# Patient Record
Sex: Female | Born: 1970 | Race: White | Hispanic: No | Marital: Married | Smoking: Never smoker
Health system: Southern US, Community
[De-identification: ages and names within clinical notes are randomized; demographics above are authoritative.]

## PROBLEM LIST (undated history)

## (undated) DIAGNOSIS — K589 Irritable bowel syndrome without diarrhea: Secondary | ICD-10-CM

## (undated) DIAGNOSIS — R000 Tachycardia, unspecified: Secondary | ICD-10-CM

## (undated) DIAGNOSIS — F3281 Premenstrual dysphoric disorder: Secondary | ICD-10-CM

## (undated) DIAGNOSIS — I498 Other specified cardiac arrhythmias: Secondary | ICD-10-CM

## (undated) DIAGNOSIS — I951 Orthostatic hypotension: Secondary | ICD-10-CM

## (undated) DIAGNOSIS — F419 Anxiety disorder, unspecified: Secondary | ICD-10-CM

## (undated) DIAGNOSIS — K839 Disease of biliary tract, unspecified: Secondary | ICD-10-CM

## (undated) DIAGNOSIS — G90A Postural orthostatic tachycardia syndrome (POTS): Secondary | ICD-10-CM

## (undated) DIAGNOSIS — R002 Palpitations: Secondary | ICD-10-CM

## (undated) DIAGNOSIS — K219 Gastro-esophageal reflux disease without esophagitis: Secondary | ICD-10-CM

## (undated) HISTORY — DX: Anxiety disorder, unspecified: F41.9

## (undated) HISTORY — DX: Palpitations: R00.2

## (undated) HISTORY — DX: Gastro-esophageal reflux disease without esophagitis: K21.9

## (undated) HISTORY — DX: Tachycardia, unspecified: R00.0

## (undated) HISTORY — DX: Premenstrual dysphoric disorder: F32.81

## (undated) HISTORY — DX: Irritable bowel syndrome, unspecified: K58.9

## (undated) HISTORY — DX: Disease of biliary tract, unspecified: K83.9

---

## 2004-01-25 ENCOUNTER — Encounter: Payer: Self-pay | Admitting: Family Medicine

## 2004-02-09 ENCOUNTER — Encounter: Payer: Self-pay | Admitting: Family Medicine

## 2004-03-11 ENCOUNTER — Encounter: Payer: Self-pay | Admitting: Family Medicine

## 2004-04-11 ENCOUNTER — Encounter: Payer: Self-pay | Admitting: Family Medicine

## 2006-06-15 ENCOUNTER — Emergency Department: Payer: Self-pay | Admitting: Internal Medicine

## 2007-01-07 ENCOUNTER — Ambulatory Visit: Payer: Self-pay | Admitting: Family Medicine

## 2007-01-21 ENCOUNTER — Ambulatory Visit: Payer: Self-pay | Admitting: Pain Medicine

## 2007-01-29 ENCOUNTER — Ambulatory Visit: Payer: Self-pay | Admitting: Pain Medicine

## 2007-03-12 HISTORY — PX: BACK SURGERY: SHX140

## 2007-03-26 ENCOUNTER — Ambulatory Visit: Payer: Self-pay | Admitting: Pain Medicine

## 2007-04-20 ENCOUNTER — Ambulatory Visit: Payer: Self-pay | Admitting: Pain Medicine

## 2007-05-05 ENCOUNTER — Ambulatory Visit: Payer: Self-pay | Admitting: Physician Assistant

## 2007-05-19 ENCOUNTER — Ambulatory Visit: Payer: Self-pay | Admitting: Pain Medicine

## 2007-05-28 ENCOUNTER — Ambulatory Visit: Payer: Self-pay

## 2007-06-01 ENCOUNTER — Ambulatory Visit: Payer: Self-pay | Admitting: Pain Medicine

## 2007-07-10 ENCOUNTER — Ambulatory Visit (HOSPITAL_COMMUNITY): Admission: RE | Admit: 2007-07-10 | Discharge: 2007-07-11 | Payer: Self-pay | Admitting: Neurosurgery

## 2007-11-13 ENCOUNTER — Ambulatory Visit: Payer: Self-pay | Admitting: Family Medicine

## 2008-01-29 ENCOUNTER — Ambulatory Visit: Payer: Self-pay | Admitting: Gastroenterology

## 2008-02-10 ENCOUNTER — Ambulatory Visit: Payer: Self-pay | Admitting: Surgery

## 2008-02-12 ENCOUNTER — Ambulatory Visit: Payer: Self-pay | Admitting: Surgery

## 2008-02-12 HISTORY — PX: CHOLECYSTECTOMY: SHX55

## 2008-04-14 ENCOUNTER — Emergency Department: Payer: Self-pay | Admitting: Emergency Medicine

## 2009-03-11 HISTORY — PX: ESOPHAGOGASTRODUODENOSCOPY: SHX1529

## 2009-07-10 ENCOUNTER — Ambulatory Visit: Payer: Self-pay | Admitting: Gastroenterology

## 2010-03-09 ENCOUNTER — Ambulatory Visit: Payer: Self-pay | Admitting: Family Medicine

## 2010-04-26 ENCOUNTER — Ambulatory Visit: Payer: Self-pay | Admitting: Orthopedic Surgery

## 2010-07-24 NOTE — Op Note (Signed)
[name], [name]                 ACCOUNT NO.:  [id number]   MEDICAL RECORD NO.:  [id number]          PATIENT TYPE:  OIB   LOCATION:  3536                         FACILITY:  [facility]   PHYSICIAN:  Danae Orleans. Venetia Maxon, M.D.  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   PROCEDURE:  Right L5-S1 microdiskectomy with microdissection.   SURGEON:  Maeola Harman, M.D.   ASSISTANT:  Coletta Memos, M.D.   ANESTHESIA:  General endotracheal anesthesia.   ESTIMATED BLOOD LOSS:  Minimal.   COMPLICATIONS:  None.   DISPOSITION:  Recovery.   INDICATIONS:  Krista Martin is a 40 year old woman with a significant disk  herniation at L5-S1 on the right with right S1 radiculopathy.  It was  elected to go to surgery for microdiskectomy.   PROCEDURE:  [name] is brought to the operating room.  Following  satisfactory uncomplicated induction of general endotracheal anesthesia  and placement of intravenous lines, the patient was placed in the prone  position on a Wilson frame.  Her low back was prepped and draped in the  usual sterile fashion.  The incision was infiltrated with 0.25% Marcaine  and 0.5% lidocaine with 1:200,000 epinephrine.  An incision was made in  the midline, carried to the lumbodorsal fascia.  This was incised to the  right side of midline.  A linear incision was made through posterior  ligament, through the lumbodorsal fascia and then subperiosteal  dissection was performed exposing the L5-S1 interspace.  Intraoperative  x-ray with marker probe confirmed correct level at the L5-S1.  Under  microscopic visualization the ligamentum flavum was detached and  removed  in a piecemeal fashion.  No bone was removed.  It was felt that the  interlaminar window was sufficiently large that the diskectomy could be  performed without bone removal.  A microscope was brought into the  field.  Using microdissection technique the right S1 nerve root was  mobilized, exposing a large free fragment of herniated disk material.  This was both within and out of the interspace.  The disk material was  removed and left a large annular rent.  Consequently it was elected to  clear the disk space of residual disk material as well, and this was  done with a variety of pituitary rongeurs.  Care was taken not to use  any curettes on the endplates and to preserve other remaining normal-  appearing disk material.  The wound was then irrigated.  Ball probes  were used, and there was felt to be significant decompression of the S1  nerve root without any residual disk material.  Hemostasis assured.  The  wound was bathed in Depo-Medrol and fentanyl.  The self-retaining  retractor was removed.  The lumbodorsal fascia was closed with 0 Vicryl  suture.  Subcutaneous tissue were  approximated with 2-0 Vicryl  interrupted inverted sutures, and the skin edges were reapproximated  with interrupted 3-0 Vicryl subcuticular stitch.  The wound was dressed  with Dermabond.  The patient was extubated in the operating room and  taken to the recovery room in stable and satisfactory condition having  tolerated the operation well.  Counts were correct at the end of the  case.      Danae Orleans. Venetia Maxon, M.D.  Electronically Signed     JDS/MEDQ  D:  07/10/2007  T:  07/10/2007  Job:  [id number]

## 2010-12-04 LAB — BASIC METABOLIC PANEL
CO2: 30
Calcium: 9.5
GFR calc Af Amer: >60
GFR calc non Af Amer: >60
Potassium: 4.2
Sodium: 140

## 2010-12-04 LAB — CBC
HCT: 40.3
Hemoglobin: 14.1
MCHC: 35.1
RBC: 4.57

## 2012-01-21 ENCOUNTER — Ambulatory Visit: Payer: Self-pay | Admitting: Internal Medicine

## 2012-03-11 DIAGNOSIS — R000 Tachycardia, unspecified: Secondary | ICD-10-CM

## 2012-03-11 HISTORY — DX: Tachycardia, unspecified: R00.0

## 2012-07-08 ENCOUNTER — Ambulatory Visit: Payer: Self-pay | Admitting: Unknown Physician Specialty

## 2012-08-19 ENCOUNTER — Emergency Department: Payer: Self-pay | Admitting: Emergency Medicine

## 2012-08-19 ENCOUNTER — Ambulatory Visit: Payer: Self-pay | Admitting: Family Medicine

## 2012-08-19 LAB — URINALYSIS, COMPLETE
Bacteria: NONE SEEN
Bilirubin,UR: NEGATIVE
Ketone: NEGATIVE
Leukocyte Esterase: NEGATIVE
Nitrite: NEGATIVE
Ph: 6 (ref 4.5–8.0)
Protein: NEGATIVE
RBC,UR: 1 /HPF (ref 0–5)
Squamous Epithelial: <1
WBC UR: 1 /HPF (ref 0–5)

## 2012-08-19 LAB — CBC
HGB: 13.8 g/dL (ref 12.0–16.0)
MCH: 29 pg (ref 26.0–34.0)
MCHC: 34.1 g/dL (ref 32.0–36.0)
MCV: 85 fL (ref 80–100)
RBC: 4.75 10*6/uL (ref 3.80–5.20)
WBC: 9.3 10*3/uL (ref 3.6–11.0)

## 2012-08-19 LAB — COMPREHENSIVE METABOLIC PANEL
Albumin: 4 g/dL (ref 3.4–5.0)
BUN: 13 mg/dL (ref 7–18)
Calcium, Total: 8.9 mg/dL (ref 8.5–10.1)
Co2: 28 mmol/L (ref 21–32)
Creatinine: 0.58 mg/dL — ABNORMAL LOW (ref 0.60–1.30)
EGFR (Non-African Amer.): >60
Glucose: 100 mg/dL — ABNORMAL HIGH (ref 65–99)
Sodium: 140 mmol/L (ref 136–145)

## 2013-01-15 ENCOUNTER — Encounter: Payer: Self-pay | Admitting: Nurse Practitioner

## 2013-04-11 DIAGNOSIS — F3281 Premenstrual dysphoric disorder: Secondary | ICD-10-CM

## 2013-04-11 HISTORY — DX: Premenstrual dysphoric disorder: F32.81

## 2013-08-08 ENCOUNTER — Ambulatory Visit: Payer: Self-pay | Admitting: Physician Assistant

## 2013-08-08 LAB — RAPID STREP-A WITH REFLX: Micro Text Report: NEGATIVE

## 2013-08-11 LAB — BETA STREP CULTURE(ARMC)

## 2014-04-07 ENCOUNTER — Ambulatory Visit: Payer: Self-pay | Admitting: Family Medicine

## 2014-09-20 ENCOUNTER — Other Ambulatory Visit: Payer: Self-pay | Admitting: Unknown Physician Specialty

## 2014-09-20 DIAGNOSIS — M5414 Radiculopathy, thoracic region: Secondary | ICD-10-CM

## 2014-09-20 DIAGNOSIS — R0781 Pleurodynia: Secondary | ICD-10-CM

## 2014-09-23 ENCOUNTER — Ambulatory Visit: Payer: Self-pay

## 2014-09-27 ENCOUNTER — Ambulatory Visit
Admission: RE | Admit: 2014-09-27 | Discharge: 2014-09-27 | Disposition: A | Discharge status: Home / Self Care | Payer: No Typology Code available for payment source | Source: Ambulatory Visit | Attending: Unknown Physician Specialty | Admitting: Unknown Physician Specialty

## 2014-09-27 DIAGNOSIS — M546 Pain in thoracic spine: Secondary | ICD-10-CM | POA: Diagnosis present

## 2014-09-27 DIAGNOSIS — R0781 Pleurodynia: Secondary | ICD-10-CM | POA: Diagnosis not present

## 2014-09-27 DIAGNOSIS — M5414 Radiculopathy, thoracic region: Secondary | ICD-10-CM

## 2014-12-07 LAB — HM MAMMOGRAPHY

## 2014-12-07 LAB — HM PAP SMEAR

## 2016-07-18 ENCOUNTER — Ambulatory Visit
Admission: EM | Admit: 2016-07-18 | Discharge: 2016-07-18 | Disposition: A | Discharge status: Home / Self Care | Payer: BLUE CROSS/BLUE SHIELD | Attending: Emergency Medicine | Admitting: Emergency Medicine

## 2016-07-18 DIAGNOSIS — F419 Anxiety disorder, unspecified: Secondary | ICD-10-CM | POA: Diagnosis not present

## 2016-07-18 DIAGNOSIS — R1114 Bilious vomiting: Secondary | ICD-10-CM

## 2016-07-18 HISTORY — DX: Other specified cardiac arrhythmias: I49.8

## 2016-07-18 HISTORY — DX: Postural orthostatic tachycardia syndrome (POTS): G90.A

## 2016-07-18 HISTORY — DX: Tachycardia, unspecified: R00.0

## 2016-07-18 HISTORY — DX: Orthostatic hypotension: I95.1

## 2016-07-18 LAB — CBC WITH DIFFERENTIAL/PLATELET
Basophils Absolute: 0.1 10*3/uL (ref 0–0.1)
Basophils Relative: 1 %
Eosinophils Absolute: 0 10*3/uL (ref 0–0.7)
Eosinophils Relative: 1 %
HCT: 42.7 % (ref 35.0–47.0)
Hemoglobin: 14.1 g/dL (ref 12.0–16.0)
Lymphocytes Relative: 21 %
Lymphs Abs: 1.5 10*3/uL (ref 1.0–3.6)
MCH: 28.3 pg (ref 26.0–34.0)
MCHC: 33 g/dL (ref 32.0–36.0)
MCV: 85.9 fL (ref 80.0–100.0)
Monocytes Absolute: 0.4 10*3/uL (ref 0.2–0.9)
Monocytes Relative: 6 %
Neutro Abs: 5.1 10*3/uL (ref 1.4–6.5)
Neutrophils Relative %: 71 %
Platelets: 272 10*3/uL (ref 150–440)
RBC: 4.97 MIL/uL (ref 3.80–5.20)
RDW: 13.7 % (ref 11.5–14.5)
WBC: 7.2 10*3/uL (ref 3.6–11.0)

## 2016-07-18 LAB — COMPREHENSIVE METABOLIC PANEL
ALT: 13 U/L — ABNORMAL LOW (ref 14–54)
AST: 20 U/L (ref 15–41)
Albumin: 4.6 g/dL (ref 3.5–5.0)
Alkaline Phosphatase: 47 U/L (ref 38–126)
Anion gap: 5 (ref 5–15)
BUN: 10 mg/dL (ref 6–20)
CO2: 25 mmol/L (ref 22–32)
Calcium: 9.3 mg/dL (ref 8.9–10.3)
Chloride: 105 mmol/L (ref 101–111)
Creatinine, Ser: 0.58 mg/dL (ref 0.44–1.00)
GFR calc Af Amer: >60 mL/min (ref >60)
GFR calc non Af Amer: >60 mL/min (ref >60)
Glucose, Bld: 109 mg/dL — ABNORMAL HIGH (ref 65–99)
Potassium: 3.8 mmol/L (ref 3.5–5.1)
Sodium: 135 mmol/L (ref 135–145)
Total Bilirubin: 0.3 mg/dL (ref 0.3–1.2)
Total Protein: 7.6 g/dL (ref 6.5–8.1)

## 2016-07-18 MED ORDER — ALPRAZOLAM 0.25 MG PO TABS: 0.2500 mg | ORAL_TABLET | Freq: Every evening | ORAL | 0 refills | Status: DC | PRN

## 2016-07-18 NOTE — ED Triage Notes (Signed)
Pt presents with vomiting and diarrhea, she has an issue with her liver producing extra bile, she is here because she feels like she needs to have her liver checked. Throwing up all morning, it was all bile.

## 2016-07-18 NOTE — ED Provider Notes (Signed)
CSN: [id number]     Arrival date & time 07/18/16  1430 History   First MD Initiated Contact with Patient 07/18/16 1516     Chief Complaint  Patient presents with  . Emesis   (Consider location/radiation/quality/duration/timing/severity/associated sxs/prior Treatment) HPI   45year-old female who presents with vomiting and diarrhea that started about 4 days ago. She become much worse today. She had one episode of vomiting this morning which was probably bile according to the patient and loose stool with mucus but no blood. She's had a feeling of fullness in her stomach since she's taken cholestyramine prescribed by a GI doctor at [facility]. Ever since she's had a cholecystectomy she's had bowel and abdominal problems. Is usually affected by stress. She has recently been under a great deal of stress with her husband having had an emergency transfer to [facility] recently for a newly diagnosed diabetes mellitus States anytime she has increased stress that she has these GI issues. She is mostly concerned today about possible liver involvement due to the amount of bowel that she has been vomiting. She is also concerned that she has taken some of the cholestyramine that was out of date by almost 2 years.        Past Medical History:  Diagnosis Date  . POTS (postural orthostatic tachycardia syndrome)    Past Surgical History:  Procedure Laterality Date  . CHOLECYSTECTOMY     History reviewed. No pertinent family history. Social History  Substance Use Topics  . Smoking status: Never Smoker  . Smokeless tobacco: Never Used  . Alcohol use No   OB History    No data available     Review of Systems  Constitutional: Positive for activity change and appetite change. Negative for chills, fatigue and fever.  Gastrointestinal: Positive for abdominal pain, diarrhea, nausea and vomiting.  Genitourinary: Negative for dysuria and hematuria.  All other systems reviewed and are negative.   Allergies   Ciprofloxacin; Imipramine; and Sertraline  Home Medications   Prior to Admission medications   Medication Sig Start Date End Date Taking? Authorizing Provider  cholestyramine (QUESTRAN) 4 g packet Take 4 g by mouth 3 (three) times daily with meals.   Yes [provider]  clonazePAM (KLONOPIN) 0.5 MG tablet Take 0.5 mg by mouth 2 (two) times daily as needed for anxiety.   Yes [provider]  gabapentin (NEURONTIN) 100 MG capsule Take 100 mg by mouth 3 (three) times daily.   Yes [provider]  magnesium 30 MG tablet Take 30 mg by mouth 2 (two) times daily.   Yes [provider]  nebivolol (BYSTOLIC) 2.5 MG tablet Take 2.5 mg by mouth daily.   Yes [provider]  ondansetron (ZOFRAN) 4 MG tablet Take 4 mg by mouth every 8 (eight) hours as needed for nausea or vomiting.   Yes [provider]  polyethylene glycol (MIRALAX / GLYCOLAX) packet Take 17 g by mouth daily.   Yes [provider]  ALPRAZolam (XANAX) 0.25 MG tablet Take 1 tablet (0.25 mg total) by mouth at bedtime as needed for anxiety. 07/18/16   Lutricia Feil, PA-C   Meds Ordered and Administered this Visit  Medications - No data to display  BP 121/75 (BP Location: Left Arm)   Pulse 79   Temp 98.3 F (36.8 C) (Oral)   Resp 18   LMP 07/14/2016   SpO2 100%  No data found.   Physical Exam  Constitutional: She is oriented to person, place,  and time. She appears well-developed and well-nourished. No distress.  HENT:  Head: Normocephalic.  Eyes: Pupils are equal, round, and reactive to light.  Neck: Normal range of motion.  Pulmonary/Chest: Effort normal and breath sounds normal. No respiratory distress. She has no wheezes. She has no rales.  Abdominal: Soft. Bowel sounds are normal. She exhibits no distension and no mass. There is no tenderness. There is no rebound and no guarding.  Musculoskeletal: Normal range of motion.  Neurological: She is alert and  oriented to person, place, and time.  Skin: Skin is warm and dry. She is not diaphoretic.  Psychiatric: She has a normal mood and affect. Her behavior is normal. Judgment and thought content normal.  Nursing note and vitals reviewed.   Urgent Care Course   Clinical Course as of Jul 19 1643  Thu Jul 18, 2016  1623 AST: 20 [WR]  1624 AST: 20 [WR]  1624 AST: 20 [WR]  1624 ALT: (!) 13 [WR]    Clinical Course User Index [WR] Lutricia Feiloemer, Tyvion Edmondson P, PA-C    Procedures (including critical care time)  Labs Review Labs Reviewed  COMPREHENSIVE METABOLIC PANEL - Abnormal; Notable for the following:       Result Value   Glucose, Bld 109 (*)    ALT 13 (*)    All other components within normal limits  CBC WITH DIFFERENTIAL/PLATELET    Imaging Review No results found.   Visual Acuity Review  Right Eye Distance:   Left Eye Distance:   Bilateral Distance:    Right Eye Near:   Left Eye Near:    Bilateral Near:         MDM   1. Bilious vomiting with nausea   2. Anxiety    Discharge Medication List as of 07/18/2016  4:38 PM    START taking these medications   Details  ALPRAZolam (XANAX) 0.25 MG tablet Take 1 tablet (0.25 mg total) by mouth at bedtime as needed for anxiety., Starting Thu 07/18/2016, Print      Plan: 1. Test/x-ray results and diagnosis reviewed with patient 2. rx as per orders; risks, benefits, potential side effects reviewed with patient 3. Recommend supportive treatment with use of Colace or Metamucil to soften her stools. Is reassured that all of her laboratories are normal. I recommend she follow-up with her primary care physician or [facility]. For her stress and her inability to sleep because the stress I will give her Xanax total of 5 pills until she can be seen by her primary care physician. 4. F/u prn if symptoms worsen or don't improve     Lutricia FeilRoemer, Vibhav Waddill P, PA-C 07/18/16 1645

## 2016-07-18 NOTE — Discharge Instructions (Signed)
Use Colace daily to twice daily for as a stool softener. Consider using Metamucil to twice daily also. Follow-up with your GI physician

## 2016-10-10 ENCOUNTER — Encounter: Payer: Self-pay | Admitting: Obstetrics and Gynecology

## 2016-10-10 ENCOUNTER — Ambulatory Visit (INDEPENDENT_AMBULATORY_CARE_PROVIDER_SITE_OTHER): Payer: BLUE CROSS/BLUE SHIELD | Admitting: Obstetrics and Gynecology

## 2016-10-10 VITALS — BP 120/80 | HR 85 | Ht 68.0 in | Wt 145.0 lb

## 2016-10-10 DIAGNOSIS — N644 Mastodynia: Secondary | ICD-10-CM | POA: Diagnosis not present

## 2016-10-10 DIAGNOSIS — N63 Unspecified lump in unspecified breast: Secondary | ICD-10-CM | POA: Diagnosis not present

## 2016-10-10 DIAGNOSIS — Z1239 Encounter for other screening for malignant neoplasm of breast: Secondary | ICD-10-CM

## 2016-10-10 DIAGNOSIS — Z1231 Encounter for screening mammogram for malignant neoplasm of breast: Secondary | ICD-10-CM

## 2016-10-10 NOTE — Progress Notes (Signed)
Chief Complaint  Patient presents with  . Breast Problem    swelling under both armpits with sharp pain    HPI:      [name] is a 46 y.o. [id number] who LMP was Patient's last menstrual period was 09/27/2016., presents today for swelling bilat breast tissue axillary area for the past 2 wks. Pt usually has these sx before menses and then they resolve when menses starts--going on for yrs. Sx didn't resolve with LMP 2 wks ago and have persisted. Pt now has noted occas sharp pains from axillary tail towards nipples bilat. No masses/erythema. Pt is anxious about sx. Pt usually doesn't drink caffeine but has had some recently. [place] and had increased emotional stress. Pt's last mammo 12/07/14, neg. Pt has a hx of severe PMDD sx 12 days before menses and is extremely sensitive to hormones and hormonal changes.  No FH breast/ovar cancer.  Has annual with PCP next wk but wants mammo ASAP since going out of town next wk.  Past Medical History:  Diagnosis Date  . Anxiety   . Biliary tract disease   . GERD (gastroesophageal reflux disease)   . Irritable bowel syndrome   . Palpitations   . PMDD (premenstrual dysphoric disorder) 04/11/2013   pt can't tolerate SSRIs, estrogen or progestin  . POTS (postural orthostatic tachycardia syndrome)   . Tachycardia 2014   dx'd c POTS    Past Surgical History:  Procedure Laterality Date  . BACK SURGERY  2009   DISCECTOMY L5 (FUSION)  . CHOLECYSTECTOMY  02/12/2008  . ESOPHAGOGASTRODUODENOSCOPY  2011    Family History  Problem Relation Age of Onset  . Heart disease Father   . Heart disease Paternal Grandmother     Social History   Social History  . Marital status: Married    Spouse name: N/A  . Number of children: N/A  . Years of education: N/A   Occupational History  . Not on file.   Social History Main Topics  . Smoking status: Never Smoker  . Smokeless tobacco: Never Used  . Alcohol use Yes    . Drug use: No  . Sexual activity: Yes    Birth control/ protection: Surgical   Other Topics Concern  . Not on file   Social History Narrative  . No narrative on file     Current Outpatient Prescriptions:  .  cholestyramine (QUESTRAN) 4 g packet, Take 4 g by mouth 3 (three) times daily with meals., Disp: , Rfl:  .  clonazePAM (KLONOPIN) 0.5 MG tablet, Take 0.5 mg by mouth 2 (two) times daily as needed for anxiety., Disp: , Rfl:  .  gabapentin (NEURONTIN) 100 MG capsule, Take 100 mg by mouth 3 (three) times daily., Disp: , Rfl:  .  nebivolol (BYSTOLIC) 2.5 MG tablet, Take 2.5 mg by mouth daily., Disp: , Rfl:  .  NEOMYCIN-POLYMYXIN-HYDROCORTISONE (CORTISPORIN) 1 % SOLN OTIC solution, Place in ear(s)., Disp: , Rfl:  .  ALPRAZolam (XANAX) 0.25 MG tablet, Take 1 tablet (0.25 mg total) by mouth at bedtime as needed for anxiety. (Patient not taking: Reported on 10/10/2016), Disp: 5 tablet, Rfl: 0 .  magnesium 30 MG tablet, Take 30 mg by mouth 2 (two) times daily., Disp: , Rfl:  .  Multiple Vitamin (MULTI-VITAMINS) TABS, as needed. , Disp: , Rfl:  .  ondansetron (ZOFRAN) 4 MG tablet, Take 4 mg by mouth every 8 (eight) hours as needed for nausea or vomiting.,  Disp: , Rfl:  .  polyethylene glycol (MIRALAX / GLYCOLAX) packet, Take 17 g by mouth daily., Disp: , Rfl:  .  SUMAtriptan (IMITREX) 50 MG tablet, , Disp: , Rfl: 4   ROS:  Review of Systems  Constitutional: Negative for fever.  Gastrointestinal: Negative for blood in stool, constipation, diarrhea, nausea and vomiting.  Genitourinary: Positive for vaginal discharge. Negative for dyspareunia, dysuria, flank pain, frequency, hematuria, urgency, vaginal bleeding and vaginal pain.  Musculoskeletal: Negative for back pain.  Skin: Negative for rash.  Neurological: Positive for headaches.  Psychiatric/Behavioral: The patient is nervous/anxious.   Breast ROS: positive for - tenderness, swelling    OBJECTIVE:   Vitals:  BP 120/80   Pulse  85   Ht 5\' 8"  (1.727 m)   Wt 145 lb (65.8 kg)   LMP 09/27/2016   BMI 22.05 kg/m   Physical Exam  Constitutional: She is oriented to person, place, and time and well-developed, well-nourished, and in no distress.  Pulmonary/Chest: Right breast exhibits tenderness. Right breast exhibits no inverted nipple, no mass, no nipple discharge and no skin change. Left breast exhibits tenderness. Left breast exhibits no inverted nipple, no mass, no nipple discharge and no skin change. Breasts are symmetrical.    BILAT AXILLARY TAILS WITH MILD SWELLING IN SITTING POSITION; NO MASSES, LESIONS  Lymphadenopathy:    She has no axillary adenopathy.  Neurological: She is alert and oriented to person, place, and time.  Psychiatric: Affect and judgment normal.  Vitals reviewed.   Assessment/Plan: Breast swelling - Slightly enlarged axillary tail bilat breast. No masses. Probably hormonal. See if sx resolve with next menses. Due for scr mammo anyway.  Breast tenderness  Screening for breast cancer - Pt to sched mammo. - Plan: MM DIGITAL SCREENING BILATERAL     Return if symptoms worsen or fail to improve.  Alicia B. Copland, PA-C 10/10/2016 12:16 PM

## 2016-10-29 ENCOUNTER — Ambulatory Visit
Admission: RE | Admit: 2016-10-29 | Discharge: 2016-10-29 | Disposition: A | Discharge status: Home / Self Care | Payer: BLUE CROSS/BLUE SHIELD | Source: Ambulatory Visit | Attending: Obstetrics and Gynecology | Admitting: Obstetrics and Gynecology

## 2016-10-29 DIAGNOSIS — Z1239 Encounter for other screening for malignant neoplasm of breast: Secondary | ICD-10-CM

## 2016-10-29 DIAGNOSIS — Z1231 Encounter for screening mammogram for malignant neoplasm of breast: Secondary | ICD-10-CM | POA: Diagnosis present

## 2016-11-04 ENCOUNTER — Other Ambulatory Visit: Payer: Self-pay | Admitting: *Deleted

## 2016-11-04 ENCOUNTER — Inpatient Hospital Stay
Admission: RE | Admit: 2016-11-04 | Discharge: 2016-11-04 | Disposition: A | Discharge status: Home / Self Care | Payer: Self-pay | Source: Ambulatory Visit | Attending: *Deleted | Admitting: *Deleted

## 2016-11-04 DIAGNOSIS — Z9289 Personal history of other medical treatment: Secondary | ICD-10-CM

## 2016-11-05 ENCOUNTER — Encounter: Payer: Self-pay | Admitting: Obstetrics and Gynecology

## 2017-11-26 ENCOUNTER — Ambulatory Visit (INDEPENDENT_AMBULATORY_CARE_PROVIDER_SITE_OTHER): Payer: BLUE CROSS/BLUE SHIELD

## 2017-11-26 ENCOUNTER — Ambulatory Visit
Admission: EM | Admit: 2017-11-26 | Discharge: 2017-11-26 | Disposition: A | Discharge status: Home / Self Care | Payer: BLUE CROSS/BLUE SHIELD | Attending: Family Medicine | Admitting: Family Medicine

## 2017-11-26 ENCOUNTER — Other Ambulatory Visit: Payer: Self-pay

## 2017-11-26 DIAGNOSIS — R140 Abdominal distension (gaseous): Secondary | ICD-10-CM | POA: Diagnosis not present

## 2017-11-26 DIAGNOSIS — B89 Unspecified parasitic disease: Secondary | ICD-10-CM

## 2017-11-26 LAB — CBC WITH DIFFERENTIAL/PLATELET
BASOS ABS: 0.1 10*3/uL (ref 0–0.1)
Basophils Relative: 1 %
EOS PCT: 1 %
Eosinophils Absolute: 0.1 10*3/uL (ref 0–0.7)
HEMATOCRIT: 42.7 % (ref 35.0–47.0)
Hemoglobin: 14.2 g/dL (ref 12.0–16.0)
LYMPHS ABS: 2.4 10*3/uL (ref 1.0–3.6)
LYMPHS PCT: 27 %
MCH: 29 pg (ref 26.0–34.0)
MCHC: 33.4 g/dL (ref 32.0–36.0)
MCV: 86.8 fL (ref 80.0–100.0)
MONO ABS: 0.6 10*3/uL (ref 0.2–0.9)
Monocytes Relative: 6 %
NEUTROS ABS: 5.7 10*3/uL (ref 1.4–6.5)
Neutrophils Relative %: 65 %
PLATELETS: 249 10*3/uL (ref 150–440)
RBC: 4.91 MIL/uL (ref 3.80–5.20)
RDW: 13.5 % (ref 11.5–14.5)
WBC: 8.8 10*3/uL (ref 3.6–11.0)

## 2017-11-26 LAB — COMPREHENSIVE METABOLIC PANEL
ALBUMIN: 4.5 g/dL (ref 3.5–5.0)
ALT: 14 U/L (ref 0–44)
ANION GAP: 12 (ref 5–15)
AST: 26 U/L (ref 15–41)
Alkaline Phosphatase: 47 U/L (ref 38–126)
BILIRUBIN TOTAL: 0.8 mg/dL (ref 0.3–1.2)
BUN: 16 mg/dL (ref 6–20)
CO2: 19 mmol/L — AB (ref 22–32)
Calcium: 9.1 mg/dL (ref 8.9–10.3)
Chloride: 106 mmol/L (ref 98–111)
Creatinine, Ser: 0.68 mg/dL (ref 0.44–1.00)
GFR calc Af Amer: >60 mL/min (ref >60)
GFR calc non Af Amer: >60 mL/min (ref >60)
GLUCOSE: 97 mg/dL (ref 70–99)
POTASSIUM: 4 mmol/L (ref 3.5–5.1)
SODIUM: 137 mmol/L (ref 135–145)
TOTAL PROTEIN: 7.6 g/dL (ref 6.5–8.1)

## 2017-11-26 MED ORDER — ALBENDAZOLE 200 MG PO TABS: 400.0000 mg | ORAL_TABLET | Freq: Once | ORAL | 0 refills | Status: AC

## 2017-11-26 MED ORDER — MEBENDAZOLE 100 MG PO CHEW: 100.0000 mg | CHEWABLE_TABLET | Freq: Two times a day (BID) | ORAL | 0 refills | Status: DC

## 2017-11-26 NOTE — Discharge Instructions (Addendum)
Increase water intake Follow up with Gastroenterologist

## 2017-11-26 NOTE — ED Triage Notes (Signed)
Pt with 4 weeks of abdominal bloating progressively worsening each week. No gallbladder. States it feels like a blockage or constipation. Took Metamucil last night and had a normal-sized bowel movement. Then this afternoon had another bowel movement and "something came out of me and it has a rubbery texture and almost looked like an intestine"

## 2017-11-26 NOTE — ED Provider Notes (Signed)
[facility]    CSN: [id number] Arrival date & time: 11/26/17  1705     History   Chief Complaint Chief Complaint  Patient presents with  . Bloated    HPI Krista Martin is a 47 y.o. female.   47 yo female with a c/o 2-3 weeks of progressively worsening abdominal discomfort and bloating with an episode today of an abnormal bowel movement with defecation of an abnormal "long, rubbery, semi-solid" foreign substance. Denies any fevers, chills, diarrhea, recent travel, unclean water exposure, suspicious foods. Does state that her dog has had some abnormal skin lesions which she does not know what they are.     The history is provided by the patient.    Past Medical History:  Diagnosis Date  . Anxiety   . Biliary tract disease   . GERD (gastroesophageal reflux disease)   . Irritable bowel syndrome   . Palpitations   . PMDD (premenstrual dysphoric disorder) 04/11/2013   pt can't tolerate SSRIs, estrogen or progestin  . POTS (postural orthostatic tachycardia syndrome)   . Tachycardia 2014   dx'd c POTS    There are no active problems to display for this patient.   Past Surgical History:  Procedure Laterality Date  . BACK SURGERY  2009   DISCECTOMY L5 (FUSION)  . CHOLECYSTECTOMY  02/12/2008  . ESOPHAGOGASTRODUODENOSCOPY  2011    OB History    Gravida  2   Para  2   Term  2   Preterm      AB      Living  2     SAB      TAB      Ectopic      Multiple      Live Births  1            Home Medications    Prior to Admission medications   Medication Sig Start Date End Date Taking? Authorizing Provider  simethicone (MYLICON) 125 MG chewable tablet Chew 125 mg by mouth every 6 (six) hours as needed for flatulence.   Yes [provider]  albendazole (ALBENZA) 200 MG tablet Take 2 tablets (400 mg total) by mouth once for 1 dose. 11/26/17 11/26/17  Payton Mccallumonty, Greer Wainright, MD  ALPRAZolam Prudy Feeler(XANAX) 0.25 MG tablet Take 1 tablet (0.25 mg total)  by mouth at bedtime as needed for anxiety. Patient not taking: Reported on 10/10/2016 07/18/16   Lutricia Feiloemer, William P, PA-C  cholestyramine Lanetta Inch(QUESTRAN) 4 g packet Take 4 g by mouth 3 (three) times daily with meals.    [provider]  clonazePAM (KLONOPIN) 0.5 MG tablet Take 0.5 mg by mouth 2 (two) times daily as needed for anxiety.    [provider]  gabapentin (NEURONTIN) 100 MG capsule Take 100 mg by mouth 3 (three) times daily.    [provider]  magnesium 30 MG tablet Take 30 mg by mouth 2 (two) times daily.    [provider]  Multiple Vitamin (MULTI-VITAMINS) TABS as needed.     [provider]  nebivolol (BYSTOLIC) 2.5 MG tablet Take 2.5 mg by mouth daily.    [provider]  NEOMYCIN-POLYMYXIN-HYDROCORTISONE (CORTISPORIN) 1 % SOLN OTIC solution Place in ear(s). 04/17/15   [provider]  ondansetron (ZOFRAN) 4 MG tablet Take 4 mg by mouth every 8 (eight) hours as needed for nausea or vomiting.    [provider]  polyethylene glycol (MIRALAX / GLYCOLAX) packet Take 17 g by mouth daily.  [provider]  SUMAtriptan (IMITREX) 50 MG tablet  07/22/16   [provider]    Family History Family History  Problem Relation Age of Onset  . Heart disease Father   . Heart disease Paternal Grandmother     Social History Social History   Tobacco Use  . Smoking status: Never Smoker  . Smokeless tobacco: Never Used  Substance Use Topics  . Alcohol use: Yes    Comment: social  . Drug use: No     Allergies   Ciprofloxacin; Imipramine; Imipramine hcl; Mirtazapine; and Sertraline   Review of Systems Review of Systems   Physical Exam Triage Vital Signs ED Triage Vitals [11/26/17 1718]  Enc Vitals Group     BP (!) 176/84     Pulse Rate (!) 109     Resp 18     Temp 98.2 F (36.8 C)     Temp Source Oral     SpO2 99 %     Weight 155 lb (70.3 kg)     Height 5\' 8"  (1.727 m)     Head  Circumference      Peak Flow      Pain Score 0     Pain Loc      Pain Edu?      Excl. in GC?    No data found.  Updated Vital Signs BP (!) 176/84 (BP Location: Left Arm)   Pulse (!) 109   Temp 98.2 F (36.8 C) (Oral)   Resp 18   Ht 5\' 8"  (1.727 m)   Wt 70.3 kg   LMP 11/15/2017   SpO2 99%   BMI 23.57 kg/m   Visual Acuity Right Eye Distance:   Left Eye Distance:   Bilateral Distance:    Right Eye Near:   Left Eye Near:    Bilateral Near:     Physical Exam  Constitutional: She appears well-developed and well-nourished. No distress.  Abdominal: Soft. Bowel sounds are normal. She exhibits distension (mild). She exhibits no mass. There is tenderness (mild; difuse). There is no rebound and no guarding.  Skin: She is not diaphoretic.  Nursing note and vitals reviewed.    UC Treatments / Results  Labs (all labs ordered are listed, but only abnormal results are displayed) Labs Reviewed  COMPREHENSIVE METABOLIC PANEL - Abnormal; Notable for the following components:      Result Value   CO2 19 (*)    All other components within normal limits  OVA + PARASITE EXAM  GASTROINTESTINAL PANEL BY PCR, STOOL (REPLACES STOOL CULTURE)  CBC WITH DIFFERENTIAL/PLATELET    EKG None  Radiology Dg Abd 2 Views  Result Date: 11/26/2017 CLINICAL DATA:  Four weeks of abdominal bloating. EXAM: ABDOMEN - 2 VIEW COMPARISON:  None. FINDINGS: The bowel gas pattern is normal. There is no free intraperitoneal air or significantly increased colonic stool burden. There are multiple bilateral pelvic calcifications which are indeterminate, although likely phleboliths. Cholecystectomy clips are noted. There are no acute osseous findings. IMPRESSION: No radiographic evidence of active abdominal process. Electronically Signed   By: Carey Bullocks M.D.   On: 11/26/2017 18:44    Procedures Procedures (including critical care time)  Medications Ordered in UC Medications - No data to  display  Initial Impression / Assessment and Plan / UC Course  I have reviewed the triage vital signs and the nursing notes.  Pertinent labs & imaging results that were available during my care of the patient were reviewed by me  and considered in my medical decision making (see chart for details).      Final Clinical Impressions(s) / UC Diagnoses   Final diagnoses:  Abdominal bloating  Parasite infection     Discharge Instructions     Increase water intake Follow up with Gastroenterologist     ED Prescriptions    Medication Sig Dispense Auth. Provider   mebendazole (VERMOX) 100 MG chewable tablet  (Status: Discontinued) Chew 1 tablet (100 mg total) by mouth 2 (two) times daily. 6 tablet Payton Mccallum, MD   albendazole (ALBENZA) 200 MG tablet Take 2 tablets (400 mg total) by mouth once for 1 dose. 2 tablet Payton Mccallum, MD      1. Labs/x-ray result (negative) and diagnosis reviewed with patient 2. rx as per orders above; reviewed possible side effects, interactions, risks and benefits; rx for albendazol (as mebendazole not covered by her insurance)  3. Recommend supportive treatment with increased fluids  4. Follow-up prn if symptoms worsen or don't improve Controlled Substance Prescriptions [place] Controlled Substance Registry consulted? Not Applicable   Payton Mccallum, MD 11/26/17 2038

## 2017-11-27 LAB — GASTROINTESTINAL PANEL BY PCR, STOOL (REPLACES STOOL CULTURE)

## 2017-12-02 LAB — OVA + PARASITE EXAM

## 2017-12-02 LAB — O&P RESULT

## 2017-12-11 ENCOUNTER — Ambulatory Visit
Admission: EM | Admit: 2017-12-11 | Discharge: 2017-12-11 | Disposition: A | Discharge status: Home / Self Care | Payer: BLUE CROSS/BLUE SHIELD | Attending: Emergency Medicine | Admitting: Emergency Medicine

## 2017-12-11 ENCOUNTER — Other Ambulatory Visit: Payer: Self-pay

## 2017-12-11 DIAGNOSIS — K219 Gastro-esophageal reflux disease without esophagitis: Secondary | ICD-10-CM

## 2017-12-11 DIAGNOSIS — K521 Toxic gastroenteritis and colitis: Secondary | ICD-10-CM

## 2017-12-11 DIAGNOSIS — Z760 Encounter for issue of repeat prescription: Secondary | ICD-10-CM

## 2017-12-11 LAB — COMPREHENSIVE METABOLIC PANEL
ALT: 16 U/L (ref 0–44)
AST: 22 U/L (ref 15–41)
Albumin: 4.5 g/dL (ref 3.5–5.0)
Alkaline Phosphatase: 49 U/L (ref 38–126)
Anion gap: 10 (ref 5–15)
BUN: 14 mg/dL (ref 6–20)
CHLORIDE: 105 mmol/L (ref 98–111)
CO2: 22 mmol/L (ref 22–32)
Calcium: 8.4 mg/dL — ABNORMAL LOW (ref 8.9–10.3)
Creatinine, Ser: 0.51 mg/dL (ref 0.44–1.00)
GFR calc Af Amer: >60 mL/min (ref >60)
Glucose, Bld: 118 mg/dL — ABNORMAL HIGH (ref 70–99)
POTASSIUM: 3.8 mmol/L (ref 3.5–5.1)
Sodium: 137 mmol/L (ref 135–145)
Total Bilirubin: 1 mg/dL (ref 0.3–1.2)
Total Protein: 7.5 g/dL (ref 6.5–8.1)

## 2017-12-11 LAB — LIPASE, BLOOD: LIPASE: 31 U/L (ref 11–51)

## 2017-12-11 MED ORDER — SODIUM CHLORIDE 0.9 % IV SOLN
Freq: Once | INTRAVENOUS | Status: AC
  Administered 2017-12-11: 16:00:00 via INTRAVENOUS

## 2017-12-11 MED ORDER — ONDANSETRON HCL 4 MG/2ML IJ SOLN
4.0000 mg | Freq: Once | INTRAMUSCULAR | Status: AC
  Administered 2017-12-11: 4 mg via INTRAMUSCULAR

## 2017-12-11 MED ORDER — CHOLESTYRAMINE 4 G PO PACK: 4.0000 g | PACK | Freq: Three times a day (TID) | ORAL | 0 refills | Status: AC

## 2017-12-11 MED ORDER — ONDANSETRON 8 MG PO TBDP: ORAL_TABLET | ORAL | 0 refills | Status: AC

## 2017-12-11 MED ORDER — PROMETHAZINE HCL 25 MG PO TABS: 25.0000 mg | ORAL_TABLET | Freq: Four times a day (QID) | ORAL | 0 refills | Status: AC | PRN

## 2017-12-11 MED ORDER — ONDANSETRON HCL 4 MG/2ML IJ SOLN: 8.0000 mg | Freq: Once | INTRAMUSCULAR | Status: DC

## 2017-12-11 NOTE — ED Provider Notes (Signed)
HPI  SUBJECTIVE:  Krista Martin is a 46 y.o. female who presents with diffuse abdominal cramping, pressure starting abruptly last night several hours after eating some questionable country fried steak last night.  States that she is unable to tolerate p.o. at all.  She reports bilious, nonbloody vomiting x8, and over 10 episodes of watery, nonbloody diarrhea.  States that the abdominal pain is resolving.  She reports 4 pound weight loss since 3 AM.  She reports decreased urine output, headache, lightheadedness, dizziness, palpitations.  She reports having a syncopal episode while defecating.  She reports a sore throat secondary to vomiting, she denies fevers, body aches.  She has tried Gatorade, crackers, cholestyramine. Her abdominal pain is worse before having emesis or diarrhea, better afterwards.  She has a past medical history of cholecystectomy, POTS, anxiety.  No history of pancreatitis, diabetes, hypertension, intestinal obstruction or other abdominal surgeries.  States that she has had syncopal episodes with vomiting and diarrhea before.  She denies preceding chest pain or shortness of breath.  LMP: Now.  PMD: Lenon Oms.  Past Medical History:  Diagnosis Date  . Anxiety   . Biliary tract disease   . GERD (gastroesophageal reflux disease)   . Irritable bowel syndrome   . Palpitations   . PMDD (premenstrual dysphoric disorder) 04/11/2013   pt can't tolerate SSRIs, estrogen or progestin  . POTS (postural orthostatic tachycardia syndrome)   . Tachycardia 2014   dx'd c POTS    Past Surgical History:  Procedure Laterality Date  . BACK SURGERY  2009   DISCECTOMY L5 (FUSION)  . CHOLECYSTECTOMY  02/12/2008  . ESOPHAGOGASTRODUODENOSCOPY  2011    Family History  Problem Relation Age of Onset  . Heart disease Father   . Heart disease Paternal Grandmother     Social History   Tobacco Use  . Smoking status: Never Smoker  . Smokeless tobacco: Never Used  Substance Use Topics   . Alcohol use: Yes    Comment: social  . Drug use: No    No current facility-administered medications for this encounter.   Current Outpatient Medications:  .  clonazePAM (KLONOPIN) 0.5 MG tablet, Take 0.5 mg by mouth 2 (two) times daily as needed for anxiety., Disp: , Rfl:  .  gabapentin (NEURONTIN) 100 MG capsule, Take 100 mg by mouth 3 (three) times daily., Disp: , Rfl:  .  magnesium 30 MG tablet, Take 30 mg by mouth 2 (two) times daily., Disp: , Rfl:  .  Multiple Vitamin (MULTI-VITAMINS) TABS, as needed. , Disp: , Rfl:  .  nebivolol (BYSTOLIC) 2.5 MG tablet, Take 2.5 mg by mouth daily., Disp: , Rfl:  .  NEOMYCIN-POLYMYXIN-HYDROCORTISONE (CORTISPORIN) 1 % SOLN OTIC solution, Place in ear(s)., Disp: , Rfl:  .  ondansetron (ZOFRAN) 4 MG tablet, Take 4 mg by mouth every 8 (eight) hours as needed for nausea or vomiting., Disp: , Rfl:  .  polyethylene glycol (MIRALAX / GLYCOLAX) packet, Take 17 g by mouth daily., Disp: , Rfl:  .  simethicone (MYLICON) 125 MG chewable tablet, Chew 125 mg by mouth every 6 (six) hours as needed for flatulence., Disp: , Rfl:  .  SUMAtriptan (IMITREX) 50 MG tablet, , Disp: , Rfl: 4 .  cholestyramine (QUESTRAN) 4 g packet, Take 1 packet (4 g total) by mouth 3 (three) times daily with meals., Disp: 30 each, Rfl: 0 .  ondansetron (ZOFRAN ODT) 8 MG disintegrating tablet, 1/2- 1 tablet q 8 hr prn nausea, vomiting, Disp: 20 tablet,  Rfl: 0 .  promethazine (PHENERGAN) 25 MG tablet, Take 1 tablet (25 mg total) by mouth every 6 (six) hours as needed for nausea or vomiting., Disp: 30 tablet, Rfl: 0  Allergies  Allergen Reactions  . Ciprofloxacin   . Imipramine Other (See Comments)    Parastesia  . Imipramine Hcl Other (See Comments)    Paresthesia, "weird sensation in brain"  . Mirtazapine Other (See Comments)    Paresthesia  . Sertraline      ROS  As noted in HPI.   Physical Exam  BP (!) 116/45 (BP Location: Right Arm)   Pulse 90   Temp 98.1 F (36.7 C)  (Oral)   Resp 14   Ht 5\' 8"  (1.727 m)   Wt 70.3 kg   LMP 12/09/2017   SpO2 100%   BMI 23.57 kg/m   Constitutional: Well developed, well nourished, no acute distress Eyes:  EOMI, conjunctiva normal bilaterally HENT: Normocephalic, atraumatic,mucus membranes tacky Respiratory: Normal inspiratory effort Cardiovascular: Regular tachycardia, no murmurs, rubs, gallops GI: nondistended soft, nontender, active bowel sounds.  No guarding, rebound. Back: No CVAT skin: No rash, skin intact Musculoskeletal: no deformities Neurologic: Alert & oriented x 3, no focal neuro deficits Psychiatric: Speech and behavior appropriate   ED Course   Medications  0.9 %  sodium chloride infusion ( Intravenous Stopped 12/11/17 1739)  ondansetron (ZOFRAN) injection 4 mg (4 mg Intramuscular Given 12/11/17 1608)    Orders Placed This Encounter  Procedures  . Comprehensive metabolic panel    Standing Status:   Standing    Number of Occurrences:   1  . Lipase, blood    Standing Status:   Standing    Number of Occurrences:   1  . Recheck vitals    Post IVF    Standing Status:   Standing    Number of Occurrences:   1  . Insert peripheral IV    Standing Status:   Standing    Number of Occurrences:   1    Results for orders placed or performed during the hospital encounter of 12/11/17 (from the past 24 hour(s))  Comprehensive metabolic panel     Status: Abnormal   Collection Time: 12/11/17  3:06 PM  Result Value Ref Range   Sodium 137 135 - 145 mmol/L   Potassium 3.8 3.5 - 5.1 mmol/L   Chloride 105 98 - 111 mmol/L   CO2 22 22 - 32 mmol/L   Glucose, Bld 118 (H) 70 - 99 mg/dL   BUN 14 6 - 20 mg/dL   Creatinine, Ser 1.61 0.44 - 1.00 mg/dL   Calcium 8.4 (L) 8.9 - 10.3 mg/dL   Total Protein 7.5 6.5 - 8.1 g/dL   Albumin 4.5 3.5 - 5.0 g/dL   AST 22 15 - 41 U/L   ALT 16 0 - 44 U/L   Alkaline Phosphatase 49 38 - 126 U/L   Total Bilirubin 1.0 0.3 - 1.2 mg/dL   GFR calc non Af Amer >60 >60 mL/min    GFR calc Af Amer >60 >60 mL/min   Anion gap 10 5 - 15  Lipase, blood     Status: None   Collection Time: 12/11/17  3:06 PM  Result Value Ref Range   Lipase 31 11 - 51 U/L   No results found.  ED Clinical Impression  Gastroenteritis due to toxin in food  Medication refill   ED Assessment/Plan  No evidence of a surgical abdomen at this time.  She does  appear to be dehydrated with tachycardia and borderline hypotension, so we will give 1 L of IV fluids addition to Zofran 8 mg IV.  Patient states that she is unable to tolerate any p.o. whatsoever.  Checking CMP, lipase repeating vitals.  Syncope sounds to be vasovagal vs dehydration.  Patient states that she has had this happen before and it has not been cardiac mediated.  Will reevaluate.  Repeat vital signs, she is normotensive and heart rate has decreased to 90.  She is also requesting a refill of her cholestyramine,  is requesting packets as they are more affordable.  Work note for today and tomorrow.  Lipase, CMP normal.  On reevaluation, patient states that she feels better.  She is tolerating p.o.  Repeat abdominal exam benign.  Presentation most consistent with a gastroenteritis.  Home with Zofran, and if this is too expensive, Phenergan.  Push electrolyte containing fluids.  Follow up with PMD as needed, to the ER if she gets worse.  Discussed labs, MDM, treatment plan, and plan for follow-up with patient. Discussed sn/sx that should prompt return to the ED. patient agrees with plan.   Meds ordered this encounter  Medications  . DISCONTD: ondansetron (ZOFRAN) injection 8 mg  . 0.9 %  sodium chloride infusion  . ondansetron (ZOFRAN) injection 4 mg  . cholestyramine (QUESTRAN) 4 g packet    Sig: Take 1 packet (4 g total) by mouth 3 (three) times daily with meals.    Dispense:  30 each    Refill:  0  . ondansetron (ZOFRAN ODT) 8 MG disintegrating tablet    Sig: 1/2- 1 tablet q 8 hr prn nausea, vomiting    Dispense:  20  tablet    Refill:  0  . promethazine (PHENERGAN) 25 MG tablet    Sig: Take 1 tablet (25 mg total) by mouth every 6 (six) hours as needed for nausea or vomiting.    Dispense:  30 tablet    Refill:  0    *This clinic note was created using Scientist, clinical (histocompatibility and immunogenetics). Therefore, there may be occasional mistakes despite careful proofreading.   ?   Domenick Gong, MD 12/11/17 1843

## 2017-12-11 NOTE — ED Triage Notes (Signed)
Patient complains of abdominal pain that started this morning with vomiting and diarrhea. Patient states that she ate some country style steak last night that did not taste right. Patient states that she feels dehydrated. Patient states that she has been trying to drink fluids but cause worsening abdominal pain.

## 2017-12-11 NOTE — Discharge Instructions (Addendum)
Zofran will make you constipated, but I think your diarrhea will slow down once you get everything out of your system.  You can also try some Imodium.  If the Zofran is too expensive, try the Phenergan instead.  Push electrolyte containing fluids such as Pedialyte.  Drink until your urine becomes pale yellow.  Go immediately to the ER for fevers above 100.4, if your abdominal pain returns, if you continue to vomit and have diarrhea despite the medicines, if you have not urinated in 12 hours, or for any other concerns.

## 2018-01-06 ENCOUNTER — Other Ambulatory Visit: Payer: Self-pay | Admitting: Nurse Practitioner

## 2018-01-06 DIAGNOSIS — R2231 Localized swelling, mass and lump, right upper limb: Secondary | ICD-10-CM

## 2018-01-06 DIAGNOSIS — Z1231 Encounter for screening mammogram for malignant neoplasm of breast: Secondary | ICD-10-CM

## 2018-04-29 ENCOUNTER — Ambulatory Visit
Admission: RE | Admit: 2018-04-29 | Discharge: 2018-04-29 | Disposition: A | Discharge status: Home / Self Care | Payer: BLUE CROSS/BLUE SHIELD | Source: Ambulatory Visit | Attending: Nurse Practitioner | Admitting: Nurse Practitioner

## 2018-04-29 ENCOUNTER — Other Ambulatory Visit: Payer: Self-pay | Admitting: Nurse Practitioner

## 2018-04-29 DIAGNOSIS — Z1231 Encounter for screening mammogram for malignant neoplasm of breast: Secondary | ICD-10-CM

## 2018-05-05 ENCOUNTER — Ambulatory Visit
Admission: RE | Admit: 2018-05-05 | Discharge: 2018-05-05 | Disposition: A | Discharge status: Home / Self Care | Payer: BLUE CROSS/BLUE SHIELD | Source: Ambulatory Visit | Attending: Nurse Practitioner | Admitting: Nurse Practitioner

## 2018-05-05 ENCOUNTER — Other Ambulatory Visit: Payer: Self-pay | Admitting: Nurse Practitioner

## 2018-05-05 DIAGNOSIS — R928 Other abnormal and inconclusive findings on diagnostic imaging of breast: Secondary | ICD-10-CM

## 2018-05-05 DIAGNOSIS — N6310 Unspecified lump in the right breast, unspecified quadrant: Secondary | ICD-10-CM

## 2018-05-07 ENCOUNTER — Other Ambulatory Visit: Payer: Self-pay | Admitting: Nurse Practitioner

## 2018-05-07 DIAGNOSIS — N649 Disorder of breast, unspecified: Secondary | ICD-10-CM

## 2018-05-07 DIAGNOSIS — R928 Other abnormal and inconclusive findings on diagnostic imaging of breast: Secondary | ICD-10-CM

## 2020-03-05 IMAGING — MG DIGITAL SCREENING BILATERAL MAMMOGRAM WITH TOMO AND CAD
6 of 10 series · 6 of 30 positions shown · non-contrast
Comparison: Previous exam(s).

CLINICAL DATA: Screening.

EXAM:
DIGITAL SCREENING BILATERAL MAMMOGRAM WITH TOMO AND CAD

[L MLO synth-2D]
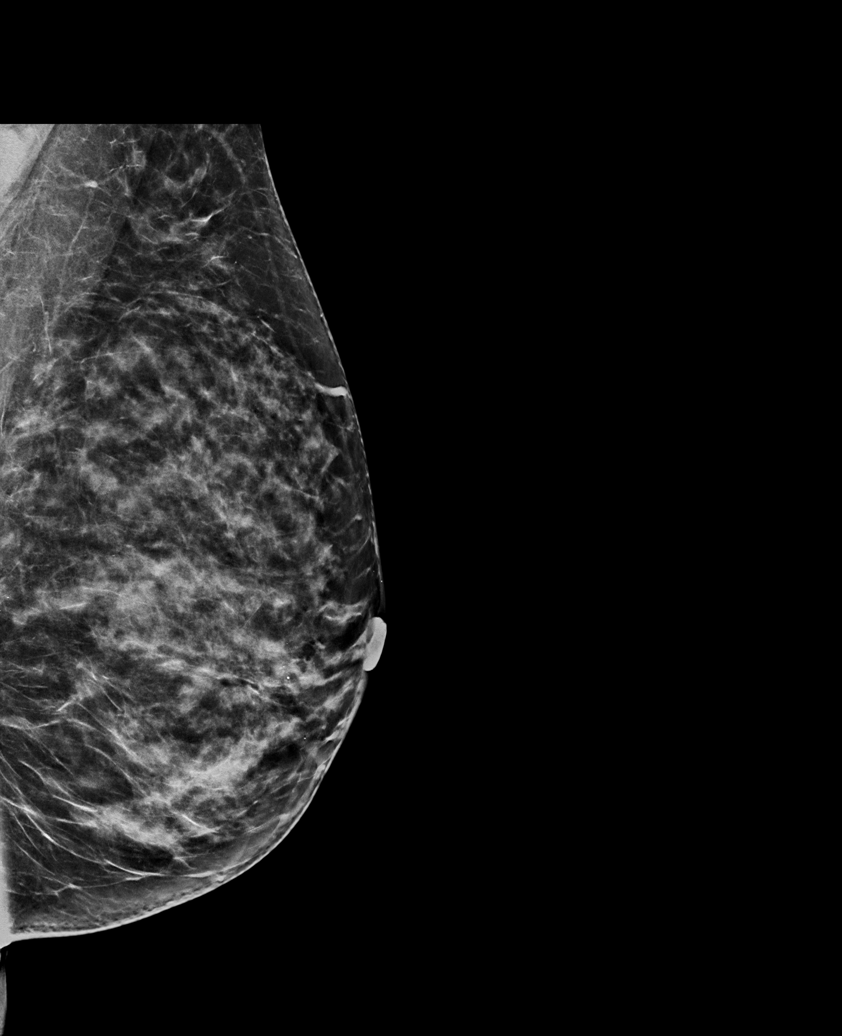

[R MLO synth-2D (1 of 2)]
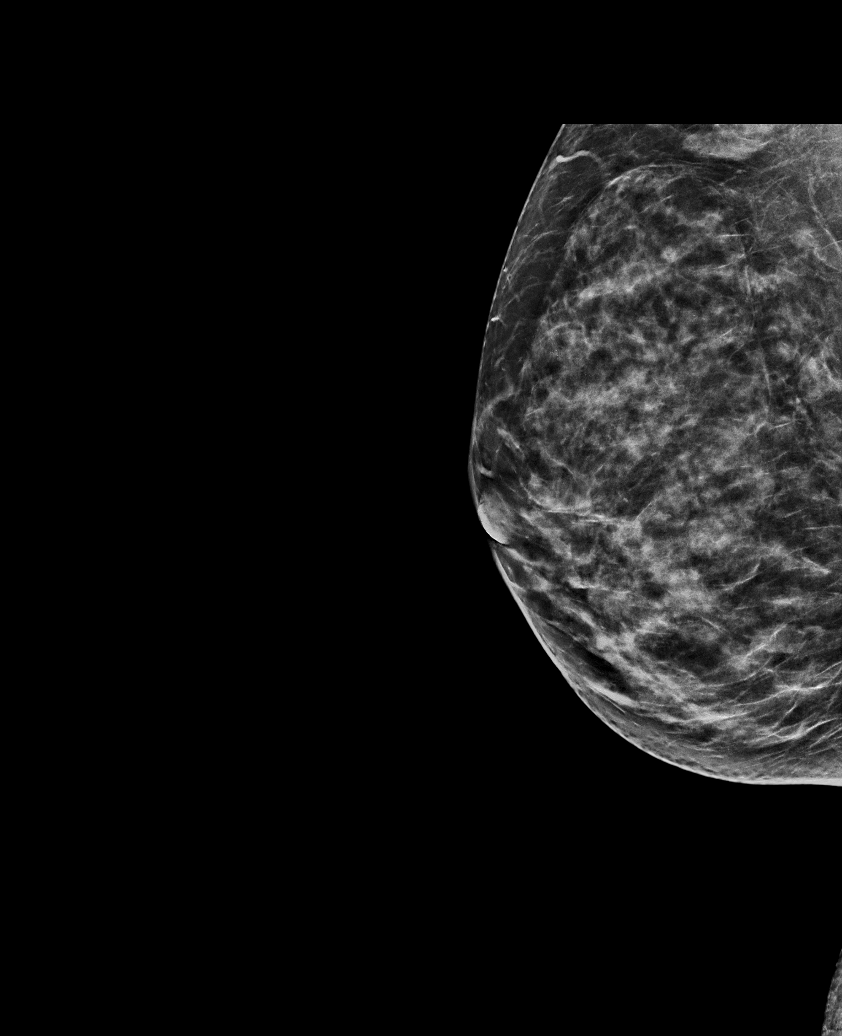

[L CC synth-2D]
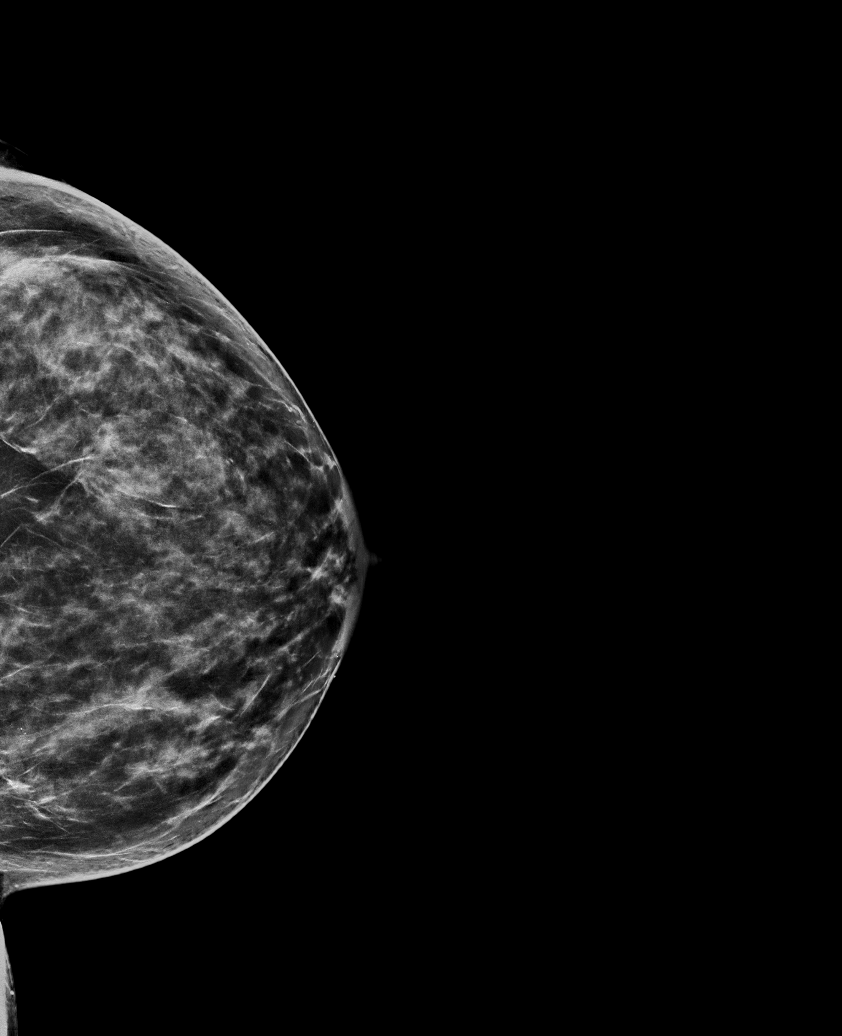

[R MLO synth-2D (2 of 2)]
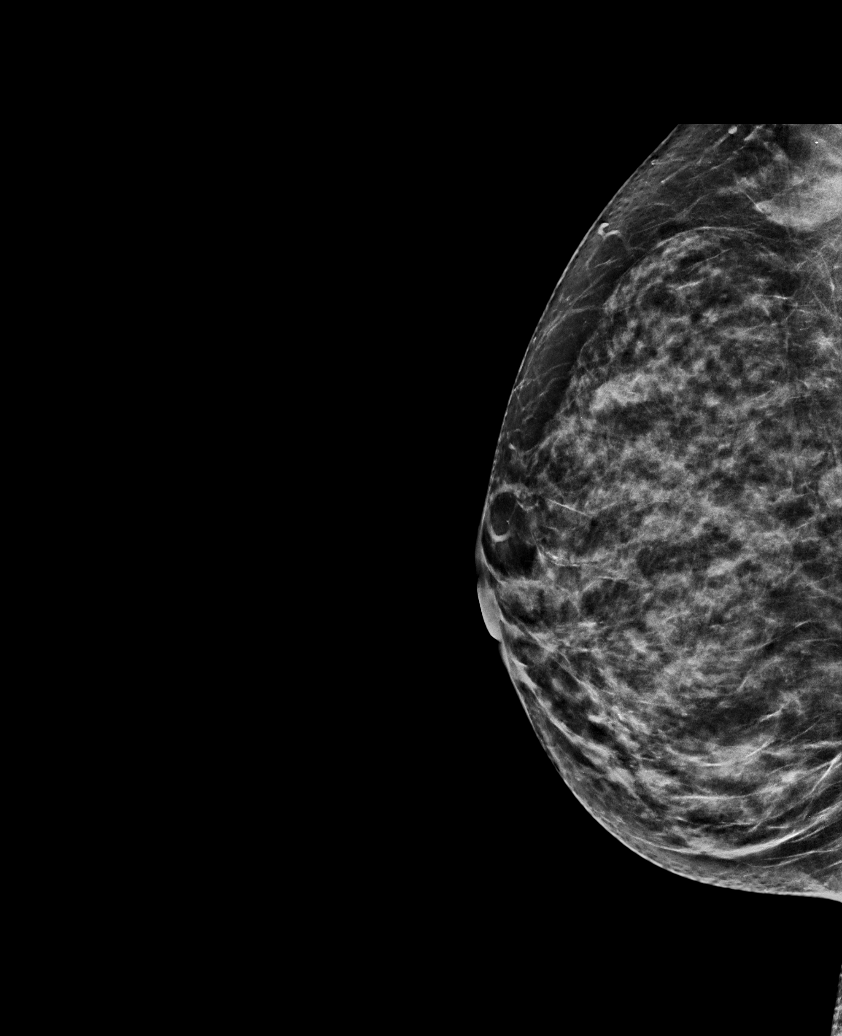

[R CC synth-2D]
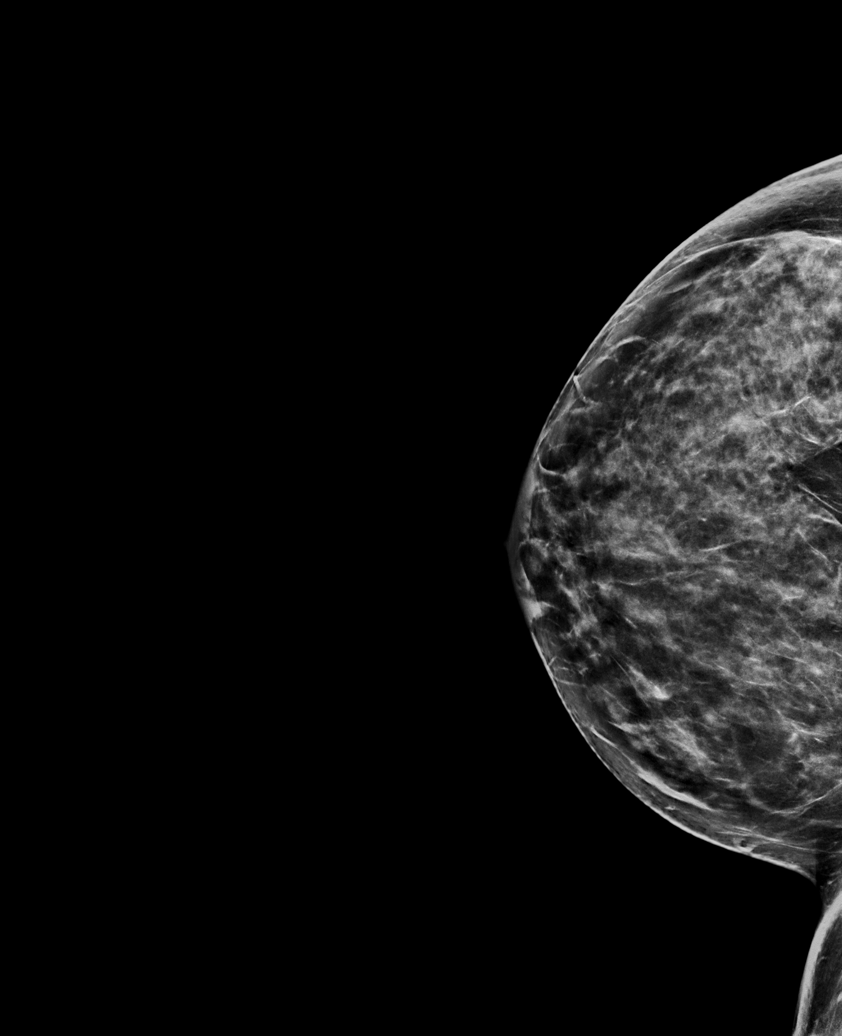

[L CC tomo · tomo slice 41/80.0]
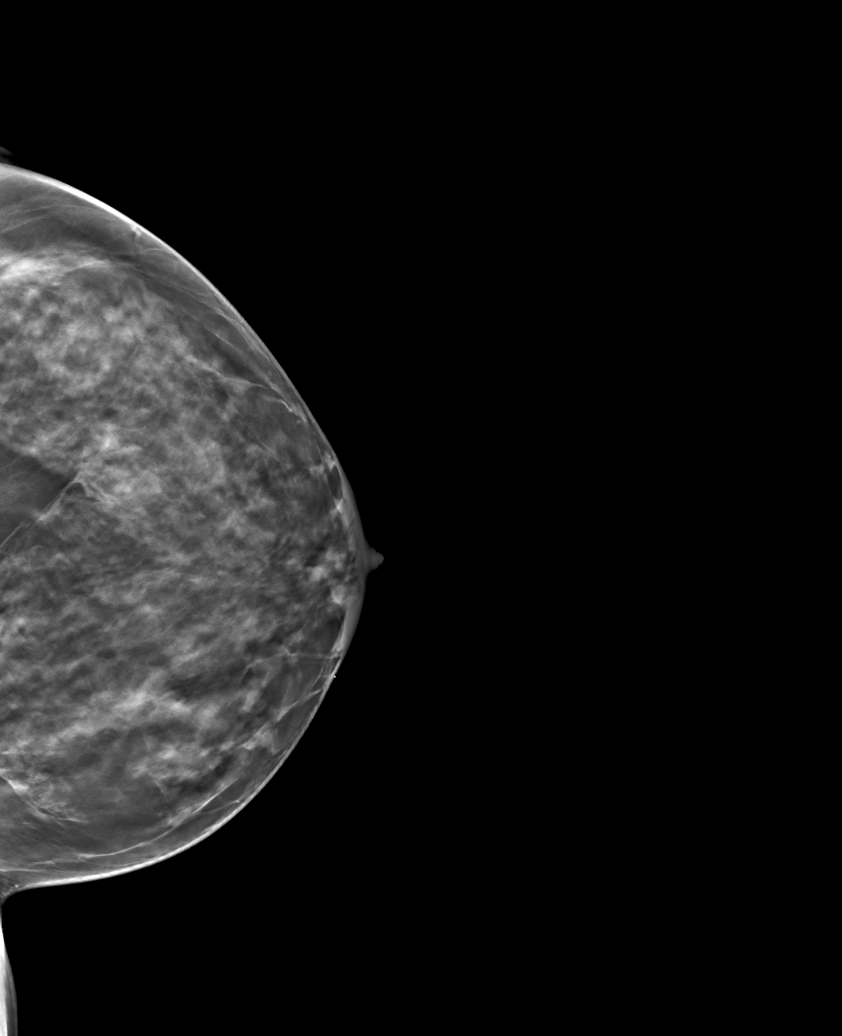

[6 of 30 positions shown; findings below may reference images not displayed]

ACR Breast Density Category c: The breast tissue is heterogeneously
dense, which may obscure small masses.
FINDINGS: In the right breast a possible mass requires further evaluation.

In the left breast 2 separate areas of possible architectural
distortion require further evaluation.

Images were processed with CAD.
IMPRESSION: Further evaluation is suggested for a possible mass in the right
breast.

Further evaluation is suggested for 2 separate areas of possible
architectural distortion in the left breast.

RECOMMENDATION:
Diagnostic mammogram and possibly ultrasound of both breasts.
(Code:M5-P-II6)

The patient will be contacted regarding the findings, and additional
imaging will be scheduled.

BI-RADS CATEGORY  0: Incomplete. Need additional imaging evaluation
and/or prior mammograms for comparison.

## 2021-07-17 ENCOUNTER — Other Ambulatory Visit: Payer: Self-pay

## 2021-07-17 ENCOUNTER — Emergency Department: Payer: 59

## 2021-07-17 ENCOUNTER — Emergency Department
Admission: EM | Admit: 2021-07-17 | Discharge: 2021-07-17 | Disposition: A | Discharge status: Home / Self Care | Payer: 59 | Attending: Emergency Medicine | Admitting: Emergency Medicine

## 2021-07-17 DIAGNOSIS — N200 Calculus of kidney: Secondary | ICD-10-CM

## 2021-07-17 DIAGNOSIS — N132 Hydronephrosis with renal and ureteral calculous obstruction: Secondary | ICD-10-CM | POA: Insufficient documentation

## 2021-07-17 DIAGNOSIS — R1031 Right lower quadrant pain: Secondary | ICD-10-CM | POA: Diagnosis present

## 2021-07-17 LAB — COMPREHENSIVE METABOLIC PANEL
ALT: 17 U/L (ref 0–44)
AST: 27 U/L (ref 15–41)
Albumin: 4.6 g/dL (ref 3.5–5.0)
Alkaline Phosphatase: 58 U/L (ref 38–126)
Anion gap: 11 (ref 5–15)
BUN: 17 mg/dL (ref 6–20)
CO2: 23 mmol/L (ref 22–32)
Calcium: 9.9 mg/dL (ref 8.9–10.3)
Chloride: 106 mmol/L (ref 98–111)
Creatinine, Ser: 0.8 mg/dL (ref 0.44–1.00)
GFR, Estimated: >60 mL/min (ref >60)
Glucose, Bld: 136 mg/dL — ABNORMAL HIGH (ref 70–99)
Potassium: 3.9 mmol/L (ref 3.5–5.1)
Sodium: 140 mmol/L (ref 135–145)
Total Bilirubin: 0.6 mg/dL (ref 0.3–1.2)
Total Protein: 7.7 g/dL (ref 6.5–8.1)

## 2021-07-17 LAB — URINALYSIS, ROUTINE W REFLEX MICROSCOPIC
Bacteria, UA: NONE SEEN
Bilirubin Urine: NEGATIVE
Glucose, UA: NEGATIVE mg/dL
Ketones, ur: 5 mg/dL — AB
Leukocytes,Ua: NEGATIVE
Nitrite: NEGATIVE
Protein, ur: NEGATIVE mg/dL
Specific Gravity, Urine: >1.046 — ABNORMAL HIGH (ref 1.005–1.030)
Squamous Epithelial / HPF: NONE SEEN (ref 0–5)
pH: 7 (ref 5.0–8.0)

## 2021-07-17 LAB — CBC WITH DIFFERENTIAL/PLATELET
Abs Immature Granulocytes: 0.04 10*3/uL (ref 0.00–0.07)
Basophils Absolute: 0.1 10*3/uL (ref 0.0–0.1)
Basophils Relative: 1 %
Eosinophils Absolute: 0 10*3/uL (ref 0.0–0.5)
Eosinophils Relative: 0 %
HCT: 43.1 % (ref 36.0–46.0)
Hemoglobin: 14.4 g/dL (ref 12.0–15.0)
Immature Granulocytes: 0 %
Lymphocytes Relative: 7 %
Lymphs Abs: 1 10*3/uL (ref 0.7–4.0)
MCH: 28 pg (ref 26.0–34.0)
MCHC: 33.4 g/dL (ref 30.0–36.0)
MCV: 83.7 fL (ref 80.0–100.0)
Monocytes Absolute: 0.5 10*3/uL (ref 0.1–1.0)
Monocytes Relative: 3 %
Neutro Abs: 12.6 10*3/uL — ABNORMAL HIGH (ref 1.7–7.7)
Neutrophils Relative %: 89 %
Platelets: 309 10*3/uL (ref 150–400)
RBC: 5.15 MIL/uL — ABNORMAL HIGH (ref 3.87–5.11)
RDW: 13 % (ref 11.5–15.5)
WBC: 14.3 10*3/uL — ABNORMAL HIGH (ref 4.0–10.5)
nRBC: 0 % (ref 0.0–0.2)

## 2021-07-17 LAB — LACTIC ACID, PLASMA
Lactic Acid, Venous: 3.3 mmol/L (ref 0.5–1.9)
Lactic Acid, Venous: 1.5 mmol/L (ref 0.5–1.9)

## 2021-07-17 LAB — LIPASE, BLOOD: Lipase: 39 U/L (ref 11–51)

## 2021-07-17 MED ORDER — PROMETHAZINE HCL 25 MG PO TABS: 25.0000 mg | ORAL_TABLET | Freq: Four times a day (QID) | ORAL | 0 refills | Status: AC | PRN

## 2021-07-17 MED ORDER — HYDROMORPHONE HCL 1 MG/ML IJ SOLN
1.0000 mg | Freq: Once | INTRAMUSCULAR | Status: AC
  Administered 2021-07-17: 1 mg via INTRAVENOUS
  Filled 2021-07-17: qty 1

## 2021-07-17 MED ORDER — KETOROLAC TROMETHAMINE 10 MG PO TABS: 10.0000 mg | ORAL_TABLET | Freq: Three times a day (TID) | ORAL | 0 refills | Status: DC | PRN

## 2021-07-17 MED ORDER — KETOROLAC TROMETHAMINE 10 MG PO TABS: 10.0000 mg | ORAL_TABLET | Freq: Three times a day (TID) | ORAL | 0 refills | Status: AC | PRN

## 2021-07-17 MED ORDER — TAMSULOSIN HCL 0.4 MG PO CAPS: 0.4000 mg | ORAL_CAPSULE | Freq: Every day | ORAL | 0 refills | Status: AC

## 2021-07-17 MED ORDER — IOHEXOL 300 MG/ML  SOLN
100.0000 mL | Freq: Once | INTRAMUSCULAR | Status: AC | PRN
  Administered 2021-07-17: 100 mL via INTRAVENOUS

## 2021-07-17 MED ORDER — LACTATED RINGERS IV BOLUS
1000.0000 mL | Freq: Once | INTRAVENOUS | Status: AC
  Administered 2021-07-17: 1000 mL via INTRAVENOUS

## 2021-07-17 MED ORDER — SODIUM CHLORIDE 0.9 % IV BOLUS: 1000.0000 mL | Freq: Once | INTRAVENOUS | Status: DC

## 2021-07-17 MED ORDER — PROMETHAZINE HCL 25 MG PO TABS: 25.0000 mg | ORAL_TABLET | Freq: Four times a day (QID) | ORAL | 0 refills | Status: DC | PRN

## 2021-07-17 MED ORDER — PROCHLORPERAZINE EDISYLATE 10 MG/2ML IJ SOLN
10.0000 mg | Freq: Once | INTRAMUSCULAR | Status: DC
  Filled 2021-07-17: qty 2

## 2021-07-17 MED ORDER — TAMSULOSIN HCL 0.4 MG PO CAPS: 0.4000 mg | ORAL_CAPSULE | Freq: Every day | ORAL | 0 refills | Status: DC

## 2021-07-17 MED ORDER — FENTANYL CITRATE PF 50 MCG/ML IJ SOSY
50.0000 ug | PREFILLED_SYRINGE | Freq: Once | INTRAMUSCULAR | Status: AC
  Administered 2021-07-17: 50 ug via INTRAVENOUS
  Filled 2021-07-17: qty 1

## 2021-07-17 MED ORDER — KETOROLAC TROMETHAMINE 30 MG/ML IJ SOLN
30.0000 mg | Freq: Once | INTRAMUSCULAR | Status: AC
  Administered 2021-07-17: 30 mg via INTRAVENOUS
  Filled 2021-07-17: qty 1

## 2021-07-17 NOTE — ED Triage Notes (Signed)
Pt comes with c/o severe right quad pain that started this am. Pt laying on floor in lobby. Pt assisted up with help of family. Pt states nausea. Pt requesting bathroom. Pt taken to bathroom but stopped and started to vomit in sink.  ? ?PA assessing pt at this time. ?

## 2021-07-17 NOTE — ED Notes (Signed)
Pt refused fluids and asked for water cleared with provider and pt given cup of water.  ?

## 2021-07-17 NOTE — ED Provider Triage Note (Signed)
?  Emergency Medicine Provider Triage Evaluation Note ? ?Krista Martin , a 51 y.o.female,  was evaluated in triage.  Pt complains of abdominal pain.  Reports sudden onset of right lower quadrant abdominal pain associated with several episodes of emesis.  Patient states that she feels something blocked inside. ? ? ?Review of Systems  ?Positive: Nausea, vomiting, abdominal pain ?Negative: Denies fever, chest pain, diarrhea ? ?Physical Exam  ? ?Vitals:  ? 07/17/21 1405  ?BP: 132/78  ?Pulse: 76  ?Resp: (!) 22  ?Temp: 97.6 ?F (36.4 ?C)  ?SpO2: 100%  ? ?Gen:   Awake, extremely distressed ?Resp:  Normal effort  ?MSK:   Moves extremities without difficulty \ ?Other:  Tenderness diffusely across the abdomen.  Unable to get full abdominal exam given lack of patient cooperation. ? ?Medical Decision Making  ?Given the patient's initial medical screening exam, the following diagnostic evaluation has been ordered. The patient will be placed in the appropriate treatment space, once one is available, to complete the evaluation and treatment. I have discussed the plan of care with the patient and I have advised the patient that an ED physician or mid-level practitioner will reevaluate their condition after the test results have been received, as the results may give them additional insight into the type of treatment they may need.  ? ? ?Diagnostics: Labs, abdominal CT, urinalysis ? ?Treatments: Compazine, fentanyl, lactated Ringers ?  ?Varney Daily, PA ?07/17/21 1409 ? ?

## 2021-07-17 NOTE — Discharge Instructions (Signed)
Please seek medical attention for any high fevers, chest pain, shortness of breath, change in behavior, persistent vomiting, bloody stool or any other new or concerning symptoms.  

## 2021-07-17 NOTE — ED Notes (Signed)
MD Cyril Loosen informed of lactic result of 3.3 ?

## 2021-07-17 NOTE — ED Provider Notes (Signed)
? ?[facility] ?Provider Note ? ? ? Event Date/Time  ? First MD Initiated Contact with Patient 07/17/21 1508   ?  (approximate) ? ? ?History  ? ?Abdominal Pain ? ? ?HPI ? ?Krista Martin is a 51 y.o. female  who, per clinic note dated 07/09/21 has history of POTS, migraines, cholecystectomy, who presents to the emergency department today because of concern for right lower quadrant and groin pain. Started this morning around 10 am. Was severe. Was constant. Worse with movement. Accompanied by vomiting. The patient has not noticed any recent change to urination. Denies any fevers. Did obtain some relief secondary to the dilaudid that was given prior to my assessment.  ? ? ?Physical Exam  ? ?Triage Vital Signs: ?ED Triage Vitals  ?Enc Vitals Group  ?   BP 07/17/21 1405 132/78  ?   Pulse Rate 07/17/21 1405 76  ?   Resp 07/17/21 1405 (!) 22  ?   Temp 07/17/21 1405 97.6 ?F (36.4 ?C)  ?   Temp Source 07/17/21 1405 Oral  ?   SpO2 07/17/21 1405 100 %  ?   Weight --   ?   Height --   ?   Head Circumference --   ?   Peak Flow --   ?   Pain Score 07/17/21 1349 10  ? ?Most recent vital signs: ?Vitals:  ? 07/17/21 1405  ?BP: 132/78  ?Pulse: 76  ?Resp: (!) 22  ?Temp: 97.6 ?F (36.4 ?C)  ?SpO2: 100%  ? ?General: Awake, alert and oriented. ?CV:  Good peripheral perfusion. Regular rate and rhythm ?Resp:  Normal effort. Lungs clear. ?Abd:  No distention. Mild tenderness to right lower quadrant.  ? ? ?ED Results / Procedures / Treatments  ? ?Labs ?(all labs ordered are listed, but only abnormal results are displayed) ?Labs Reviewed  ?COMPREHENSIVE METABOLIC PANEL - Abnormal; Notable for the following components:  ?    Result Value  ? Glucose, Bld 136 (*)   ? All other components within normal limits  ?CBC WITH DIFFERENTIAL/PLATELET - Abnormal; Notable for the following components:  ? WBC 14.3 (*)   ? RBC 5.15 (*)   ? Neutro Abs 12.6 (*)   ? All other components within normal limits  ?LACTIC ACID, PLASMA -  Abnormal; Notable for the following components:  ? Lactic Acid, Venous 3.3 (*)   ? All other components within normal limits  ?LIPASE, BLOOD  ?URINALYSIS, ROUTINE W REFLEX MICROSCOPIC  ?LACTIC ACID, PLASMA  ? ? ? ?EKG ? ?INance Pear, attending physician, personally viewed and interpreted this EKG ? ?EKG Time: 1454 ?Rate: 88 ?Rhythm: sinus rhythm ?Axis: RBBB ?Intervals: qtc 523 ?QRS: RBBB ?ST changes: no st elevation ?Impression: abnormal ekg ? ?RADIOLOGY ?I independently interpreted and visualized the CT abd/pel. My interpretation: Right sided hydronephrosis with distal ureteral stone ?Radiology interpretation:  ?IMPRESSION:  ?1. Obstructing 4 x 4 mm stone at the right ureterovesicular junction  ?with moderate proximal hydroureteronephrosis.  ?2. Possible right ureterocele.  ?3. Mild hepatic steatosis.  ?4. Post cholecystectomy with mild intra and extrahepatic biliary  ?ductal dilatation, likely related to prior cholecystectomy.  ?Recommend correlation with LFTs. If elevated, recommend further  ?evaluation with MRCP on an elective basis after resolution of acute  ?event.  ? ? ? ?PROCEDURES: ? ?Critical Care performed: No ? ?Procedures ? ? ?MEDICATIONS ORDERED IN ED: ?Medications  ?prochlorperazine (COMPAZINE) injection 10 mg (10 mg Intravenous Patient Refused/Not Given 07/17/21 1400)  ?fentaNYL (SUBLIMAZE) injection  50 mcg (50 mcg Intravenous Given 07/17/21 1401)  ?lactated ringers bolus 1,000 mL (1,000 mLs Intravenous New Bag/Given 07/17/21 1401)  ?HYDROmorphone (DILAUDID) injection 1 mg (1 mg Intravenous Given 07/17/21 1444)  ?iohexol (OMNIPAQUE) 300 MG/ML solution 100 mL (100 mLs Intravenous Contrast Given 07/17/21 1501)  ? ? ? ?IMPRESSION / MDM / ASSESSMENT AND PLAN / ED COURSE  ?I reviewed the triage vital signs and the nursing notes. ?             ?               ? ?Differential diagnosis includes, but is not limited to, kidney stone, UTI, appendicitis. ? ?Patient presents to the emergency department today  because of concern for right lower quadrant pain. The patient had some minor tenderness on exam. The ct scan showed kidney stone. Urine without findings concerning for infection. Initial lactic acid level was elevated however it did correct with fluids, and I think likely secondary to dehydration rather than infection. Patient did feel better after toradol. Given clinical improvement I do not think patient necessitates admission at this time. Will plan on discharging with medication to help with symptoms.  ? ? ?FINAL CLINICAL IMPRESSION(S) / ED DIAGNOSES  ? ?Final diagnoses:  ?Kidney stone  ? ? ? ?Note:  This document was prepared using Dragon voice recognition software and may include unintentional dictation errors. ? ?  ?Nance Pear, MD ?07/17/21 1914 ? ?

## 2021-07-17 NOTE — ED Notes (Signed)
Pt gives verbal consent to DC
